# Patient Record
Sex: Female | Born: 1975 | Race: Asian | Hispanic: No | Marital: Single | Smoking: Never smoker
Health system: Southern US, Community
[De-identification: ages and names within clinical notes are randomized; demographics above are authoritative.]

## PROBLEM LIST (undated history)

## (undated) DIAGNOSIS — I83813 Varicose veins of bilateral lower extremities with pain: Secondary | ICD-10-CM

## (undated) HISTORY — DX: Varicose veins of bilateral lower extremities with pain: I83.813

---

## 2005-08-21 ENCOUNTER — Ambulatory Visit (HOSPITAL_COMMUNITY): Admission: RE | Admit: 2005-08-21 | Discharge: 2005-08-21 | Payer: Self-pay | Admitting: Obstetrics and Gynecology

## 2005-08-21 ENCOUNTER — Encounter (INDEPENDENT_AMBULATORY_CARE_PROVIDER_SITE_OTHER): Payer: Self-pay | Admitting: *Deleted

## 2006-09-08 ENCOUNTER — Inpatient Hospital Stay (HOSPITAL_COMMUNITY): Admission: AD | Admit: 2006-09-08 | Discharge: 2006-09-11 | Payer: Self-pay | Admitting: Obstetrics and Gynecology

## 2010-08-16 NOTE — Op Note (Signed)
[name], [name]                ACCOUNT NO.:  [id number]   MEDICAL RECORD NO.:  [id number]          PATIENT TYPE:  INP   LOCATION:  9165                          [facility]ACILITY:  [facility]   PHYSICIAN:  Gerrit Friends. Aldona Bar, M.D.   DATE OF BIRTH:  1975-12-01   DATE OF PROCEDURE:  09/08/2006  DATE OF DISCHARGE:                               OPERATIVE REPORT   PREOPERATIVE DIAGNOSIS:  Term pregnancy, labor, unfavorable for vaginal  delivery.   POSTOPERATIVE DIAGNOSIS:  Term pregnancy, labor, unfavorable for vaginal  delivery, delivery of 7 pounds 11 ounces female infant, Apgars 7/9, and  meconium stained amniotic fluid.   PROCEDURE:  Primary low transverse cesarean section.   SURGEON:  Gerrit Friends. Aldona Bar, M.D.   ANESTHESIA:  Spinal, Dr. Pamalee Leyden.   HISTORY:  This 35 year old gravida 2, para 0, presented at term on the  morning of June 7 in what appeared to be early labor with a reassuring  fetal heart tracing.  According to the patient and her husband, she had  been contracting for approximately 24 hours with contractions increasing  in discomfort level and frequency.  When initially examined, the vertex  was presenting at -4 station, vertex was ballottable, cervix was  fingertip dilated, 50% effaced.  The patient was contracting about every  6-8 minutes and was very uncomfortable.   The patient was admitted and observed. Her contractions continued.  The  cervix dilated a slight bit more, but the presenting part remained  essentially ballottable.  Her discomfort level was extremely.  It was my  feeling that in view of the patient's overall size that this probably  represented a cephalopelvic that portion and due to the fact that she  was a primigravida with a totally unengaged vertex, after a thorough  discussion with the patient, her husband, and her family, the decision  was made to proceed with primary low transverse cesarean section for  delivery because of her being unfavorable for vaginal  delivery.   DESCRIPTION OF PROCEDURE:  The patient was taken to the operating room  where, after the satisfactory induction of spinal anesthetic by Dr.  Pamalee Leyden, the patient was prepped and draped with a Foley catheter  inserted as part of the prep.   Once the base was adequately draped and good anesthetic levels  documented, the procedure was begun.  A Pfannenstiel incision was made  with minimal difficulty and dissected down sharply to and through the  fascia in a low transverse fashion.  Hemostasis was created at each  layer.  The subfascial space was created inferiorly and superiorly,  muscles separated in the midline, peritoneum identified, and appropriate  care taken to avoid bowel superiorly and the bladder inferiorly.  Membranes were intact but there was obvious meconium of a very light  nature. Once the membranes were ruptured, attempted delivery was carried  out and ultimately required the vacuum extractor to facilitate the  delivery from the vertex position.  The patient was delivered a viable  female infant which cried spontaneously once and once the cord was clamped  and cut, the infant was passed  off to the awaiting team and subsequently  taken to the regular nursery in good condition.  Subsequently, the  weight was found to be 7 pounds 11 ounces and Apgars were assigned 7/9.   After cord bloods were collected, the placenta was delivered intact.  The uterus was then exteriorized and rendered free of any remaining  products of conception.  Then, closure of the uterine incision was  carried out with #1 Vicryl in a running locked fashion.  Good uterine  contractility was afforded with slowly given intravenous Pitocin and  stimulation.  Several figure-of-eight #1 Vicryls were placed for  additional hemostasis and to reinforce the primary suture line.  The  uterine incision at this time was noted to be dry.  The uterus was well  contracted.  The tubes and ovaries were normal.   The abdomen, at this  time, was lavaged of all free blood and clot, the uterus replaced in the  abdominal cavity.  All counts were noted to be correct and no foreign  bodies at this time were noted to be within the abdominal cavity.   At this time, closure of the abdomen was carried out in layers.  The  abdominal peritoneum was closed with 0 Vicryl in a running fashion and  muscles secured with the same.  The fascia, at this time, was  reapproximated with 0 Vicryl from angle to midline bilaterally.  Subcutaneous tissues were rendered hemostatic and staples were used to  close the skin.  A sterile pressure dressing was applied.  The patient  was transported to the recovery room in satisfactory condition, having  tolerated the procedure well.  Estimated blood loss 500 mL.  All counts  correct x2.  At the conclusion of the procedure, both mother and baby  were doing well in their respective recovery areas.   In summary, this patient presented with a floating vertex at term and  was a primigravida.  It was felt that estimated fetal weight was at  least 7 to 7 1/2 pounds and this was probably the mitigating factor in  that this was probably to big for this petite female.  She was taken to  the operating room for primary low transverse cesarean section and was  delivered of a 7 pounds 11 ounces female infant with Apgars of 7/9.      Gerrit Friends. Aldona Bar, M.D.  Electronically Signed     RMW/MEDQ  D:  09/08/2006  T:  09/08/2006  Job:  [id number]

## 2010-08-19 NOTE — Op Note (Signed)
NAMENIGERIA, LASSETER                ACCOUNT NO.:  [id number]   MEDICAL RECORD NO.:  [id number]          PATIENT TYPE:  AMB   LOCATION:  SDC                           [facility]:  [facility]   PHYSICIAN:  Malva Limes, M.D.    DATE OF BIRTH:  04/28/1975   DATE OF PROCEDURE:  08/21/2005  DATE OF DISCHARGE:                                 OPERATIVE REPORT   PREOPERATIVE DIAGNOSIS:  Missed AB.   POSTOP DIAGNOSIS:  Missed AB.   PROCEDURE:  Dilation, evacuation.   SURGEON:  Dr. Dareen Piano.   ANESTHESIA:  MAC paracervical block.   ESTIMATED BLOOD LOSS:  Minimal.   COMPLICATIONS:  None.   SPECIMEN:  Products of conception sent to pathology.   ANTIBIOTICS:  Ancef 1 gram.   DRAINS:  None.   PROCEDURE:  The patient was taken to the operating room where she was placed  in dorsal lithotomy position.  The patient had a MAC anesthesia administered  without complication.  She was then prepped with Betadine and draped in  usual fashion for this procedure.  A sterile speculum was placed in vagina,  10 mL of 1% lidocaine used for paracervical block.  Single-tooth tenaculum  applied to anterior cervical lip.  The cervical os was then dilated to a 29-  [place] and 7 mm suction cannula was placed in the uterine cavity and  products of conception withdrawn. Sharp curettage was then performed  followed by repeat suction.  The patient tolerated the procedure well.  She  was discharged home.  She will follow up in the office in 4 weeks.  She will  be given Keflex 500 mg q.i.d. for 2 days.  She will be given RhoGAM if Rh  negative.           ______________________________  Malva Limes, M.D.     MA/MEDQ  D:  08/21/2005  T:  08/21/2005  Job:  [id number]

## 2010-08-19 NOTE — Discharge Summary (Signed)
[name], [name]                ACCOUNT NO.:  [id number]   MEDICAL RECORD NO.:  [id number]          PATIENT TYPE:  INP   LOCATION:  9124                          [facility]   PHYSICIAN:  Kendra H. Tenny Craw, MD     DATE OF BIRTH:  May 09, 1975   DATE OF ADMISSION:  09/08/2006  DATE OF DISCHARGE:  09/11/2006                               DISCHARGE SUMMARY   FINAL DIAGNOSIS:  1. Intrauterine pregnancy at term in active labor cream and      unfavorable for vaginal delivery.  2. Meconium-stained amniotic fluid.   PROCEDURE:  Primary low transverse cesarean section.  Surgeon: Dr.  Annamaria Helling.  Complications:  None.   This 35 year old G2, P0-0-1-0, presents at term in early labor with  reassuring fetal heart tracing.  The patient said she had been  contracting for about 24 hours with increasing level of discomfort and  frequency. Upon initial exam, the patient's cervix was only fingertip  dilated, 50% effaced, at a -4 station, and she was contracting about  every 6-8 minutes.  The patient's antepartum course up to this point has  been uncomplicated without any problems.  She had a negative group B  strep culture obtained in our office at 36 weeks.   HOSPITAL COURSE:  She was admitted in this early labor with continued  contractions.  Her cervix dilated slightly more, but the presenting part  remained essentially ballottable, and her discomfort was extreme.  Because of the overall size of the baby and fact that the patient is a  G1, P0, with an unengaged vertex,  discussion was held with the patient  and her husband , and decision was made to proceed with cesarean  section.   The patient was taken to the operating room on September 08, 2006, by Dr.  Annamaria Helling where a primary low transverse cesarean section was  performed with the delivery of a 7 pound 11 ounce female infant with  Apgars of 7/9. The delivery went without complications.   The patient's postoperative course was benign  without any significant  fevers.  They did not want their little boy circumcised before  discharge. The patient was felt ready for discharge on postoperative day  #3 and was sent home on a regular diet, told to decrease activities, was  given Percocet one to two every 4 hours as needed for pain, was to  follow up in our office in 4 weeks.   LABORATORY DATA ON DISCHARGE:  The patient had a hemoglobin of 9.7,  white blood cell count of 10.0 and platelets of 152,000.     Leilani Able, P.A.-C.      Freddrick March. Tenny Craw, MD  Electronically Signed   MB/MEDQ  D:  10/12/2006  T:  10/13/2006  Job:  [id number]

## 2011-01-19 LAB — COMPREHENSIVE METABOLIC PANEL
ALT: 16
Calcium: 8.5
Glucose, Bld: 91
Sodium: 134 — ABNORMAL LOW
Total Protein: 5.8 — ABNORMAL LOW

## 2011-01-19 LAB — CBC
HCT: 28.1 — ABNORMAL LOW
Hemoglobin: 12.4
Hemoglobin: 9.7 — ABNORMAL LOW
MCHC: 34.1
MCV: 92.1
Platelets: 180
RDW: 13.8
RDW: 14.2 — ABNORMAL HIGH

## 2011-01-19 LAB — URIC ACID: Uric Acid, Serum: 3.5

## 2011-01-19 LAB — LACTATE DEHYDROGENASE: LDH: 129

## 2011-01-19 LAB — RAPID HIV SCREEN (WH-MAU): Rapid HIV Screen: NONREACTIVE

## 2011-07-05 ENCOUNTER — Ambulatory Visit: Payer: BC Managed Care – PPO | Admitting: Emergency Medicine

## 2011-07-05 ENCOUNTER — Ambulatory Visit: Payer: BC Managed Care – PPO

## 2011-07-05 VITALS — BP 109/76 | HR 99 | Temp 98.2°F | Resp 16 | Ht 62.0 in | Wt 127.6 lb

## 2011-07-05 DIAGNOSIS — R51 Headache: Secondary | ICD-10-CM

## 2011-07-05 DIAGNOSIS — R509 Fever, unspecified: Secondary | ICD-10-CM

## 2011-07-05 DIAGNOSIS — M791 Myalgia, unspecified site: Secondary | ICD-10-CM

## 2011-07-05 DIAGNOSIS — J029 Acute pharyngitis, unspecified: Secondary | ICD-10-CM

## 2011-07-05 LAB — COMPREHENSIVE METABOLIC PANEL
ALT: 35 U/L (ref 0–35)
AST: 23 U/L (ref 0–37)
Albumin: 4.4 g/dL (ref 3.5–5.2)
Calcium: 9.3 mg/dL (ref 8.4–10.5)
Chloride: 97 mEq/L (ref 96–112)
Potassium: 3.9 mEq/L (ref 3.5–5.3)
Sodium: 135 mEq/L (ref 135–145)
Total Protein: 8.2 g/dL (ref 6.0–8.3)

## 2011-07-05 LAB — POCT URINALYSIS DIPSTICK
Bilirubin, UA: NEGATIVE
Leukocytes, UA: NEGATIVE

## 2011-07-05 LAB — POCT CBC
Granulocyte percent: 70 %G (ref 37–80)
HCT, POC: 38.9 % (ref 37.7–47.9)
MID (cbc): 1 — AB (ref 0–0.9)
POC Granulocyte: 9.1 — AB (ref 2–6.9)
POC LYMPH PERCENT: 22.2 %L (ref 10–50)
Platelet Count, POC: 246 10*3/uL (ref 142–424)
RBC: 4.42 M/uL (ref 4.04–5.48)
RDW, POC: 13.2 %

## 2011-07-05 LAB — POCT UA - MICROSCOPIC ONLY

## 2011-07-05 LAB — POCT URINE PREGNANCY: Preg Test, Ur: NEGATIVE

## 2011-07-05 LAB — POCT SEDIMENTATION RATE: POCT SED RATE: 89 mm/hr — AB (ref 0–22)

## 2011-07-05 MED ORDER — BUTALBITAL-APAP-CAFFEINE 50-325-40 MG PO TABS: ORAL_TABLET | ORAL | Status: DC

## 2011-07-05 NOTE — Progress Notes (Signed)
  Subjective:    Patient ID: [name], female    DOB: 1975-05-17, 36 y.o.   MRN: [id number]  HPI patient states she traveled to [place]. She returned on February 25. Since that time she has had temperatures up to 104105. She has fevers chills myalgias as well as a severe sore throat with swollen glands in her neck. She denies nausea or vomiting she denies cough she denies any burning stinging or pain on urination. She is also complaining of a persistent headache and has been unable to get relief of this.    Review of Systems she had her menstrual period approximately one week ago.     Objective:   Physical Exam  Constitutional: She appears well-developed.       Patient appears more chronically ill than acutely ill. She does not appear toxic  HENT:  Head: Normocephalic and atraumatic.  Eyes: Pupils are equal, round, and reactive to light.  Neck: Neck supple. No thyromegaly present.       She has shoddy anterior and posterior cervical nodes  Cardiovascular: Normal rate, regular rhythm and normal heart sounds.  Exam reveals no gallop and no friction rub.   No murmur heard. Pulmonary/Chest: No respiratory distress. She has no wheezes. She has no rales. She exhibits no tenderness.  Abdominal: She exhibits no distension and no mass. There is no tenderness. There is no rebound and no guarding.  Musculoskeletal: She exhibits no edema.  Skin: Skin is warm and dry.    [facility] reading (PRIMARY) by  Dr.Teandra Harlan  x-ray showed no acute disease .      Assessment & Plan:  Assessment as FUO. Patient is traveled to [place] so tropical diseases are definitely in the differential including typhoid and malaria she does have evidence of infection of the tonsils however her strep test is negative when ahead and send off a throat culture. As well as checking EBV and CMV titers. Blood cultures were done to look for typhoid. See med should help [place] regarding her liver function tests. Also check a thyroid at  patient request

## 2011-07-05 NOTE — Patient Instructions (Signed)
Please go to [facility] and have smears done for malaria. Please see me at 1 PM on Friday to go over all your test results

## 2011-07-06 LAB — EPSTEIN-BARR VIRUS VCA ANTIBODY PANEL: EBV VCA IgM: 0.16 {ISR}

## 2011-07-07 ENCOUNTER — Encounter: Payer: Self-pay | Admitting: Radiology

## 2011-07-07 LAB — CMV IGM: CMV IgM: 0.16 (ref <0.90)

## 2011-07-07 LAB — CULTURE, GROUP A STREP: Organism ID, Bacteria: NORMAL

## 2012-06-11 ENCOUNTER — Ambulatory Visit: Payer: Self-pay | Admitting: Family Medicine

## 2012-06-11 VITALS — BP 120/80 | HR 72 | Temp 98.2°F | Resp 16 | Ht 62.0 in | Wt 121.6 lb

## 2012-06-11 DIAGNOSIS — L0291 Cutaneous abscess, unspecified: Secondary | ICD-10-CM

## 2012-06-11 DIAGNOSIS — L0390 Cellulitis, unspecified: Secondary | ICD-10-CM

## 2012-06-11 MED ORDER — DOXYCYCLINE HYCLATE 100 MG PO TABS: 100.0000 mg | ORAL_TABLET | Freq: Two times a day (BID) | ORAL | Status: DC

## 2012-06-11 NOTE — Progress Notes (Signed)
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  Onset:  March 2  Obj:  NAD Multiple extremity 3 mm pustules  Assessment: staph infection  Plan:  Doxycycline Cellulitis - Plan: doxycycline (VIBRA-TABS) 100 MG tablet, DISCONTINUED: doxycycline (VIBRA-TABS) 100 MG tablet    Elvina Sidle, MD

## 2012-07-04 ENCOUNTER — Encounter: Payer: Self-pay | Admitting: Emergency Medicine

## 2013-10-16 IMAGING — CR DG CHEST 2V
2 series · 2 of 2 positions shown · non-contrast
Comparison: None.

CLINICAL DATA: 36-year-old female with fever and chills.

CHEST - 2 VIEW

[PA]
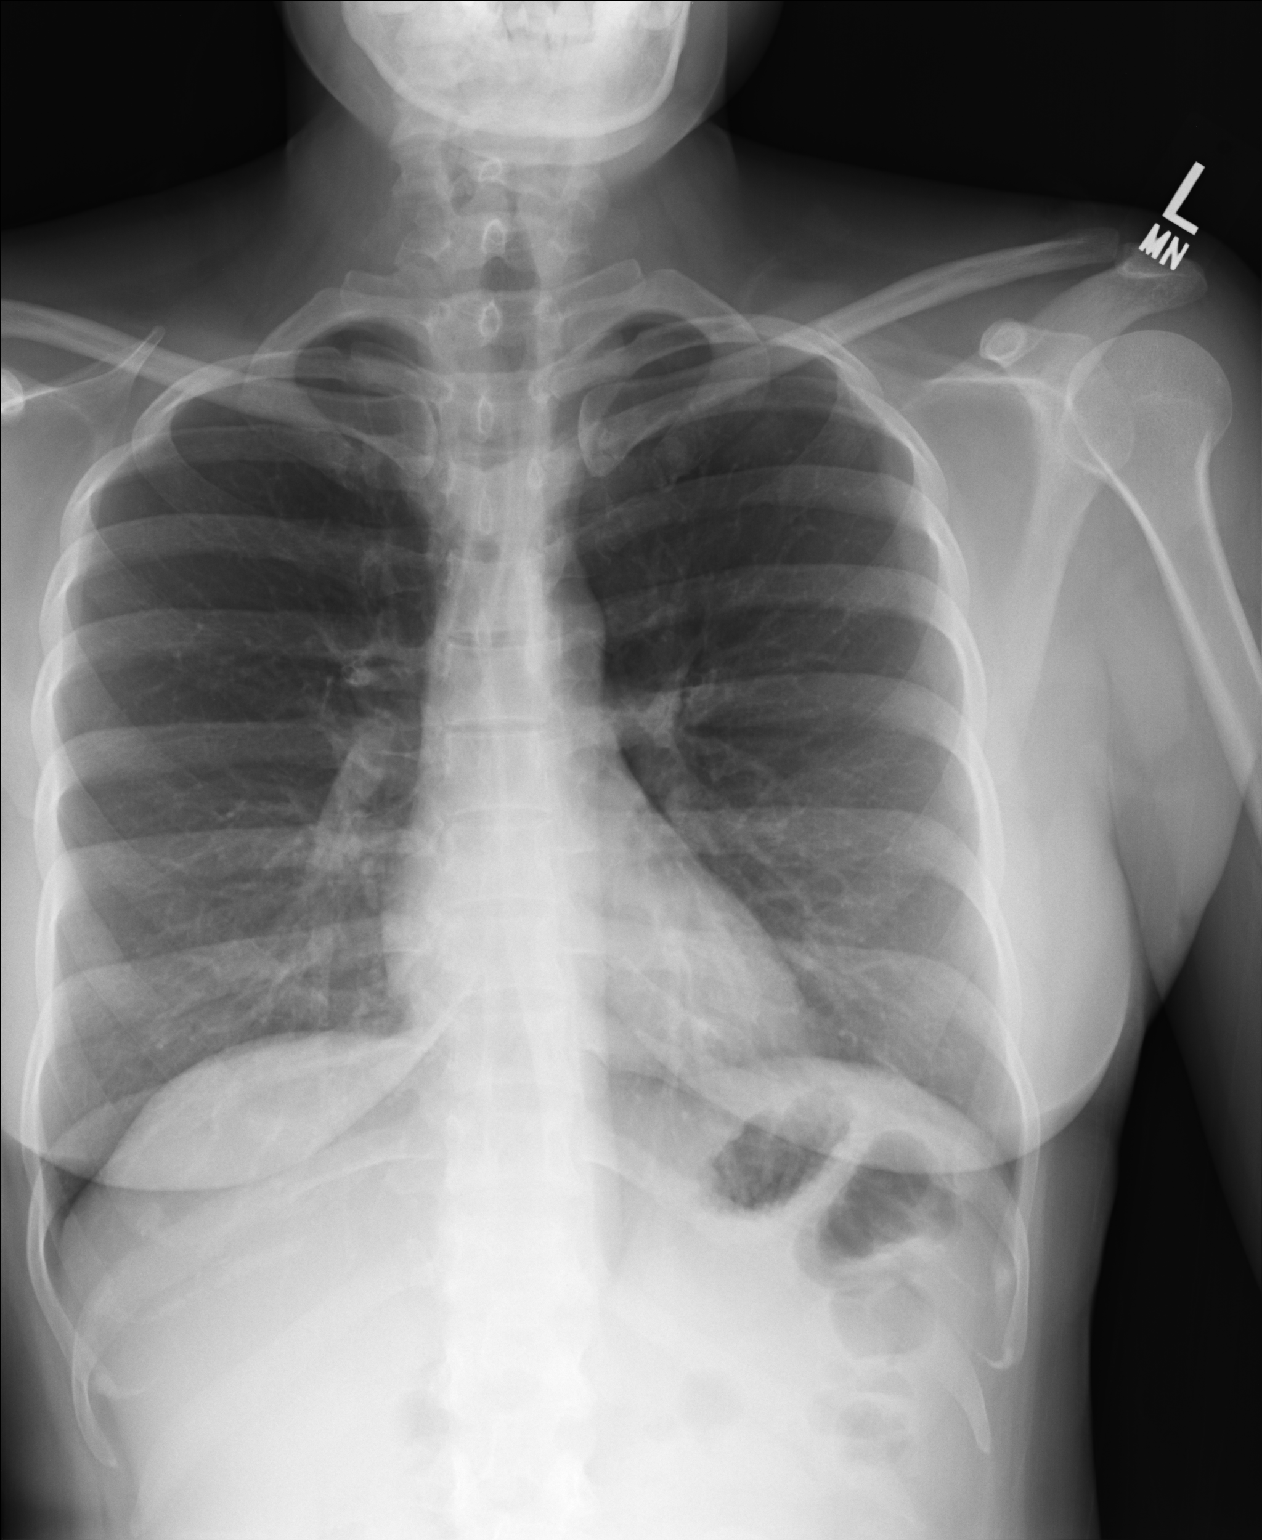

[lateral]
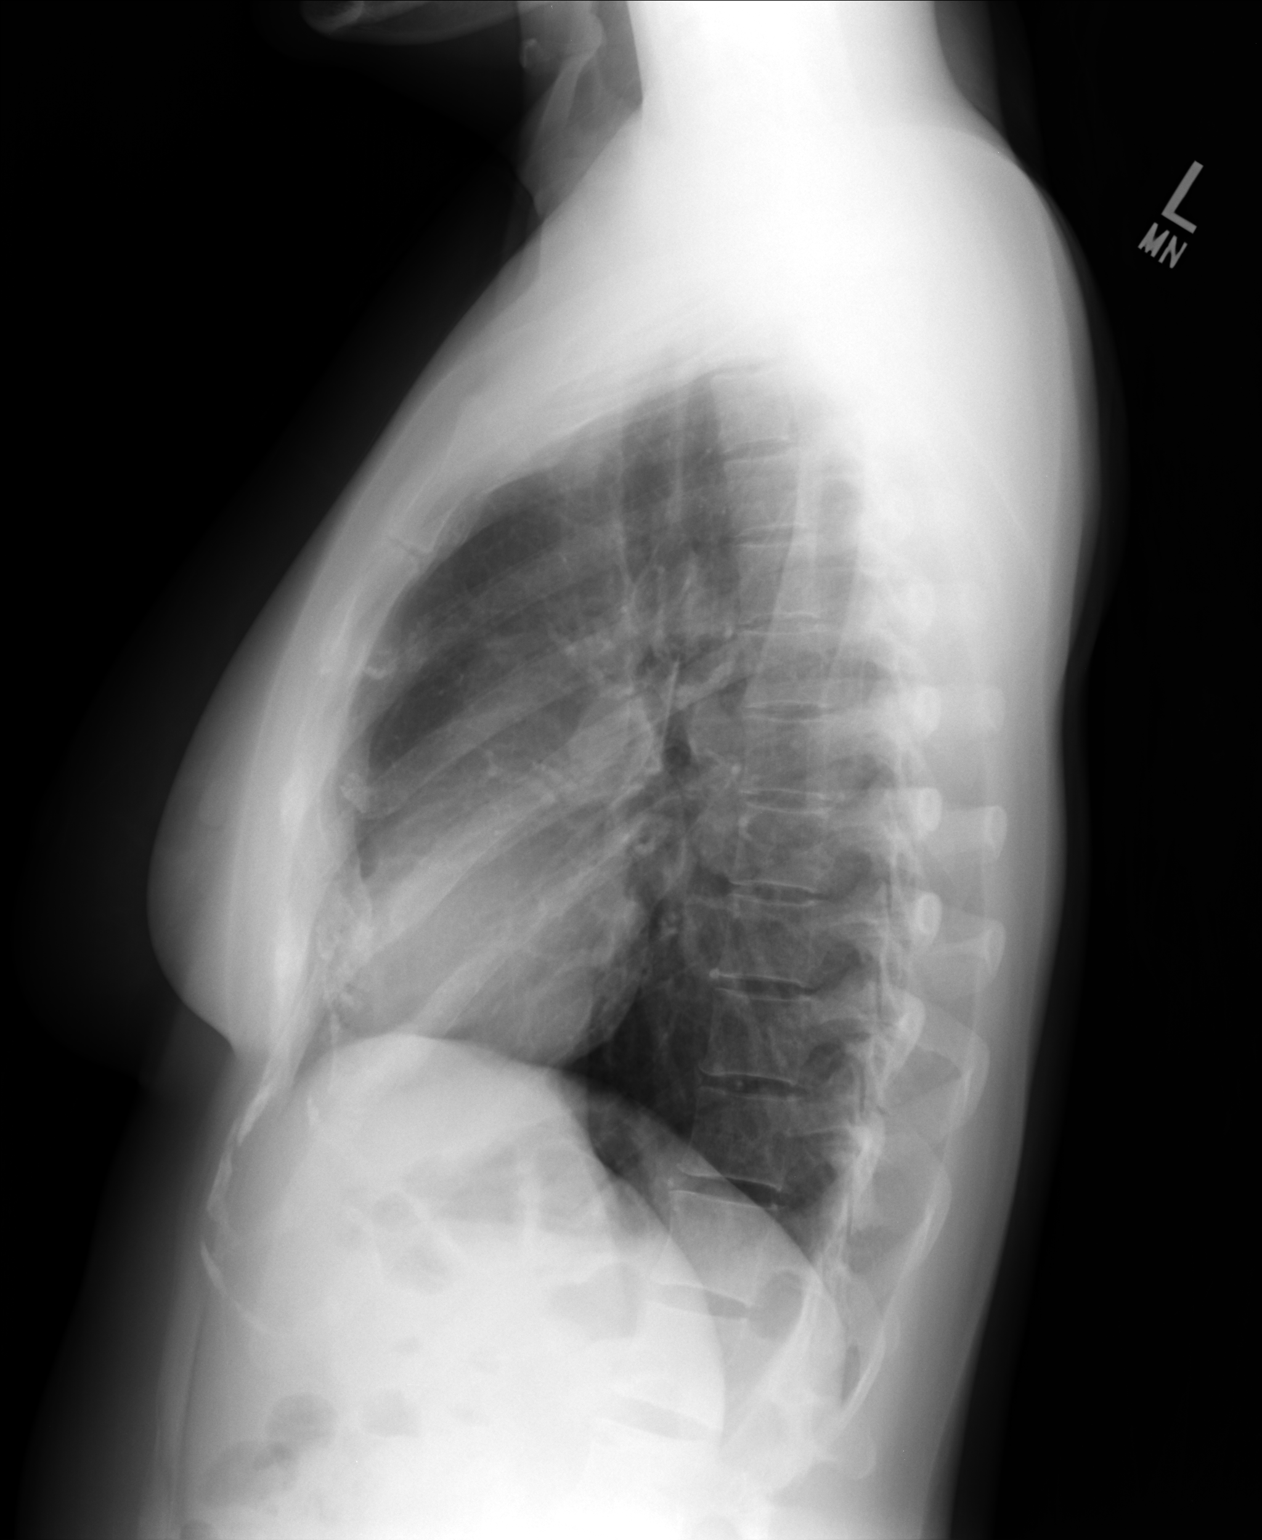

[2 of 2 positions shown; findings below may reference images not displayed]

FINDINGS: Normal lung volumes. Normal cardiac size and mediastinal
contours.  Visualized tracheal air column is within normal limits.
The lungs are clear.  No effusion. No osseous abnormality
identified.    Negative visualized bowel gas.
IMPRESSION: Negative, no acute cardiopulmonary abnormality.

## 2017-05-01 ENCOUNTER — Encounter: Payer: Self-pay | Admitting: Family Medicine

## 2017-05-01 ENCOUNTER — Ambulatory Visit (INDEPENDENT_AMBULATORY_CARE_PROVIDER_SITE_OTHER): Payer: BLUE CROSS/BLUE SHIELD | Admitting: Family Medicine

## 2017-05-01 VITALS — BP 108/80 | HR 72 | Temp 98.1°F | Resp 12 | Ht 62.0 in | Wt 142.0 lb

## 2017-05-01 DIAGNOSIS — R51 Headache: Secondary | ICD-10-CM | POA: Diagnosis not present

## 2017-05-01 DIAGNOSIS — R0789 Other chest pain: Secondary | ICD-10-CM

## 2017-05-01 DIAGNOSIS — I83893 Varicose veins of bilateral lower extremities with other complications: Secondary | ICD-10-CM

## 2017-05-01 DIAGNOSIS — R0600 Dyspnea, unspecified: Secondary | ICD-10-CM | POA: Diagnosis not present

## 2017-05-01 DIAGNOSIS — R519 Headache, unspecified: Secondary | ICD-10-CM | POA: Insufficient documentation

## 2017-05-01 NOTE — Patient Instructions (Addendum)
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 Muscular, GERD.  Chest Wall Pain Chest wall pain is pain in or around the bones and muscles of your chest. Sometimes, an injury causes this pain. Sometimes, the cause may not be known. This pain may take several weeks or longer to get better. Follow these instructions at home: Pay attention to any changes in your symptoms. Take these actions to help with your pain:  Rest as told by your health care provider.  Avoid activities that cause pain. These include any activities that use your chest muscles or your abdominal and side muscles to lift heavy items.  If directed, apply ice to the painful area: ? Put ice in a plastic bag. ? Place a towel between your skin and the bag. ? Leave the ice on for 20 minutes, 2-3 times per day.  Take over-the-counter and prescription medicines only as told by your health care provider.  Do not use tobacco products, including cigarettes, chewing tobacco, and e-cigarettes. If you need help quitting, ask your health care provider.  Keep all follow-up visits as told by your health care provider. This is important.  Contact a health care provider if:  You have a fever.  Your chest pain becomes worse.  You have new symptoms. Get help right away if:  You have nausea or vomiting.  You feel sweaty or light-headed.  You have a cough with phlegm (sputum) or you cough up blood.  You develop shortness of breath. This information is not intended to replace advice given to you by your health care provider. Make sure you discuss any questions you have with your health care provider. Document Released: 03/20/2005 Document Revised: 07/29/2015 Document Reviewed: 06/15/2014 Elsevier Interactive Patient Education  2018 [employer]  Vein disease is a condition  that can affect the veins in the legs. It can cause leg pain, varicose veins, swollen legs, or open sores. Varicose veins are swollen and twisted veins. Things that may help: leg exercises (ankle flexion, walking),compression stocking, OTC horse chestnut seed extract 300 mg twice daily, for itchy skin (if any) cortisone and moisturizers.  Compression stockings- [facility] in [place]   Please be sure medication list is accurate. If a new problem present, please set up appointment sooner than planned today.

## 2017-05-01 NOTE — Progress Notes (Signed)
HPI:   [name] is a 41 y.o. female, who is here today with her boyfriend to establish care.  Former PCP: N/A Last preventive routine visit: 2014.  Chronic medical problems: Otherwise healthy, except for lower extremity varicose veins. Is still having achy and heavy sensation on her lower extremities, exacerbated by prolonged standing. Compression stockings do not help much. She has not varicose veins surgery on both extremities.  According to patient, surgeon did not recommend more surgeries. She denies skin lesions or edema.  She is also complaining of 4-6 months of intermittent shortness of breath and chest pressure like pain, "sometimes", usually at rest. Describes sensation like she "cannot get enough O2" and needs to open windows to get air. No wheezing or cough.  Symptoms seem to be exacerbated by stress and sometimes upon reaching to get something. Alleviated by "calming down." Last episode about 2 days ago.  She denies associated palpitations, nausea, vomiting, diaphoresis, or syncope. Episodes last about 5 minutes. She does not exercise regularly but has a "very active job", she denies any above described symptoms with exertion.   Also occasional left frontal headache, usually during stressful days.  No associated visual changes, photophobia, nausea, vomiting, or focal deficit.  She can have a headache a few times per month or every 6 months, frequency depends of the level of stress.  She denies history of anxiety or depression.  She takes OTC Ibuprofen if needed, not very often.    Review of Systems  Constitutional: Negative for activity change, appetite change, fatigue and fever.  HENT: Negative for mouth sores, nosebleeds and trouble swallowing.   Eyes: Negative for redness and visual disturbance.  Respiratory: Positive for shortness of breath. Negative for cough and wheezing.   Cardiovascular: Positive for chest pain. Negative for palpitations and  leg swelling.  Gastrointestinal: Negative for abdominal pain, nausea and vomiting.       Negative for changes in bowel habits.  Endocrine: Negative for cold intolerance and heat intolerance.  Genitourinary: Negative for decreased urine volume, dysuria and hematuria.  Musculoskeletal: Positive for myalgias. Negative for gait problem.  Skin: Negative for pallor and rash.  Neurological: Negative for syncope, weakness, numbness and headaches.  Psychiatric/Behavioral: Negative for confusion and sleep disturbance. The patient is nervous/anxious.       No current outpatient medications on file prior to visit.   No current facility-administered medications on file prior to visit.      Past Medical History:  Diagnosis Date  . Varicose veins of bilateral lower extremities with pain    S/P surgery 08/2016 and 12/2016   Allergies  Allergen Reactions  . Naproxen Nausea And Vomiting    Family History  Problem Relation Age of Onset  . Diabetes Father   . Hypertension Father   . Asthma Son     Social History   Socioeconomic History  . Marital status: Married    Spouse name: None  . Number of children: None  . Years of education: None  . Highest education level: None  Social Needs  . Financial resource strain: None  . Food insecurity - worry: None  . Food insecurity - inability: None  . Transportation needs - medical: None  . Transportation needs - non-medical: None  Occupational History  . None  Tobacco Use  . Smoking status: Never Smoker  . Smokeless tobacco: Never Used  Substance and Sexual Activity  . Alcohol use: No    Frequency: Never  . Drug  use: No  . Sexual activity: Yes  Other Topics Concern  . None  Social History Narrative  . None    Vitals:   05/01/17 1529  BP: 108/80  Pulse: 72  Resp: 12  Temp: 98.1 F (36.7 C)  SpO2: 100%    Body mass index is 25.97 kg/m.   Physical Exam  Nursing note and vitals reviewed. Constitutional: She is oriented to  person, place, and time. She appears well-developed and well-nourished. No distress.  HENT:  Head: Normocephalic and atraumatic.  Mouth/Throat: Oropharynx is clear and moist and mucous membranes are normal.  Eyes: Conjunctivae are normal. Pupils are equal, round, and reactive to light.  Neck: No tracheal deviation present. No thyroid mass and no thyromegaly present.  Cardiovascular: Normal rate and regular rhythm.  No murmur heard. Pulses:      Dorsalis pedis pulses are 2+ on the right side, and 2+ on the left side.  Varicose vein LE, L>R  Respiratory: Effort normal and breath sounds normal. No respiratory distress. She exhibits no tenderness.  GI: Soft. She exhibits no mass. There is no hepatomegaly. There is no tenderness.  Musculoskeletal: She exhibits no edema.  Lymphadenopathy:    She has no cervical adenopathy.  Neurological: She is alert and oriented to person, place, and time. She has normal strength. Gait normal.  Skin: Skin is warm. No rash noted. No erythema.  Psychiatric: Her mood appears anxious.  Well groomed, good eye contact.     ASSESSMENT AND PLAN:   [name] was seen today for establish care.  Diagnoses and all orders for this visit:  Dyspnea, unspecified type  We discussed possible etiologies. ? Anxiety. I do not think imaging is needed at this time. She does not want lab work done today , prefers to wait until she comes back for her physical. Instructed about warning signs.  -     EKG 12-Lead  Chest pressure  History does not suggest cardiac etiology, low probability given her CV risk factors. ?  Wall chest pain. Possible causes discussed. EKG today SR,normal axis and intervals, no abnormalities. No other EKG available for comparison. Instructed about warning signs. 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 Encouraged to wear compression stockings  daily. Follow-up as needed.  Headache, unspecified headache type  ? Tension headache vs migraine. She can continue OTC Ibuprofen if needed. Instructed about warning signs.     Betty G. Swazil[place]Jordan, MD  [facility]. [place] office.

## 2017-05-29 ENCOUNTER — Encounter: Payer: BLUE CROSS/BLUE SHIELD | Admitting: Family Medicine

## 2017-05-31 NOTE — Progress Notes (Signed)
HPI:   [name] is a 42 y.o. female, who is here today with her fiance for her routine physical.  Last CPE: 2014  Regular exercise 3 or more time per week: Not regularly but she has an active job. Following a healthy diet: Yes  She lives with her son.  Chronic medical problems: Headache,varicose veins.  Pap smear 2016 Hx of abnormal pap smears: Denies Hx of STD's Denies  Condoms for birth control.   There is no immunization history on file for this patient.  Mammogram: She has not had one and she is not interested.   She has no new concerns today.  I saw her on 05/01/17,when she was c/o a few months of fatigue and SOB with chest pressure sensation. Intermittent,symptoms are not related with exertion. She has not had dyspnea,staes that it is mainly fatigue. Symptoms exacerbated by stress.  She did not have blood work because she was not sure about insurance coverage.  She is planning on going to [place] to take care of her mother,who has been sick. She is planning on staying there for a month.   Review of Systems  Constitutional: Positive for fatigue. Negative for appetite change, diaphoresis, fever and unexpected weight change.  HENT: Negative for dental problem, hearing loss, mouth sores, trouble swallowing and voice change.   Eyes: Negative for photophobia and visual disturbance.  Respiratory: Negative for cough and wheezing.   Cardiovascular: Negative for chest pain, palpitations and leg swelling.  Gastrointestinal: Negative for abdominal pain, nausea and vomiting.       No changes in bowel habits.  Endocrine: Negative for cold intolerance, heat intolerance, polydipsia, polyphagia and polyuria.  Genitourinary: Negative for decreased urine volume, dysuria, hematuria, vaginal bleeding and vaginal discharge.  Musculoskeletal: Negative for arthralgias, gait problem and neck pain.  Skin: Negative for color change and rash.  Allergic/Immunologic: Negative  for environmental allergies.  Neurological: Positive for headaches (No more frequent that usual. Hx of chronic headaches.). Negative for seizures, syncope, weakness and numbness.  Hematological: Negative for adenopathy. Does not bruise/bleed easily.  Psychiatric/Behavioral: Negative for confusion and sleep disturbance. The patient is not nervous/anxious.   All other systems reviewed and are negative.     No current outpatient medications on file prior to visit.   No current facility-administered medications on file prior to visit.      Past Medical History:  Diagnosis Date  . Varicose veins of bilateral lower extremities with pain    S/P surgery 08/2016 and 12/2016    Past Surgical History:  Procedure Laterality Date  . CESAREAN SECTION      Allergies  Allergen Reactions  . Naproxen Nausea And Vomiting    Family History  Problem Relation Age of Onset  . Diabetes Father   . Hypertension Father   . Asthma Son     Social History   Socioeconomic History  . Marital status: Single    Spouse name: None  . Number of children: None  . Years of education: None  . Highest education level: None  Social Needs  . Financial resource strain: None  . Food insecurity - worry: None  . Food insecurity - inability: None  . Transportation needs - medical: None  . Transportation needs - non-medical: None  Occupational History  . None  Tobacco Use  . Smoking status: Never Smoker  . Smokeless tobacco: Never Used  Substance and Sexual Activity  . Alcohol use: No    Frequency: Never  .  Drug use: No  . Sexual activity: Yes  Other Topics Concern  . None  Social History Narrative  . None     Vitals:   06/01/17 0901  BP: 124/78  Pulse: 83  Resp: 12  Temp: 97.8 F (36.6 C)  SpO2: 99%   Body mass index is 25.49 kg/m.   Wt Readings from Last 3 Encounters:  06/01/17 139 lb 6 oz (63.2 kg)  05/01/17 142 lb (64.4 kg)  06/11/12 121 lb 9.6 oz (55.2 kg)    Physical Exam    Nursing note and vitals reviewed. Constitutional: She is oriented to person, place, and time. She appears well-developed. No distress.  HENT:  Head: Normocephalic and atraumatic.  Right Ear: Hearing, tympanic membrane, external ear and ear canal normal.  Left Ear: Hearing, tympanic membrane, external ear and ear canal normal.  Mouth/Throat: Uvula is midline, oropharynx is clear and moist and mucous membranes are normal.  Eyes: Conjunctivae are normal. Pupils are equal, round, and reactive to light.  Neck: No tracheal deviation present. No thyromegaly present.  Cardiovascular: Normal rate and regular rhythm.  No murmur heard. Pulses:      Dorsalis pedis pulses are 2+ on the right side, and 2+ on the left side.  Respiratory: Effort normal and breath sounds normal. No respiratory distress.  GI: Soft. She exhibits no mass. There is no hepatomegaly. There is no tenderness.  Genitourinary:  Genitourinary Comments: Breast: No masses,skin abnormalities,or nipple discharge.  Musculoskeletal: She exhibits no edema or tenderness.  No major deformity or sing of synovitis appreciated.  Lymphadenopathy:    She has no cervical adenopathy.       Right: No supraclavicular adenopathy present.       Left: No supraclavicular adenopathy present.  Neurological: She is alert and oriented to person, place, and time. She has normal strength. No cranial nerve deficit. Coordination and gait normal.  Reflex Scores:      Bicep reflexes are 2+ on the right side and 2+ on the left side.      Patellar reflexes are 2+ on the right side and 2+ on the left side. Skin: Skin is warm. No rash noted. No erythema.  Psychiatric: Her mood appears anxious.  Well groomed, poor eye contact.      ASSESSMENT AND PLAN:  [name] was here today annual physical examination.   Orders Placed This Encounter  Procedures  . Comprehensive metabolic panel  . Lipid panel   Lab Results  Component Value Date   CHOL 170  06/01/2017   HDL 42.40 06/01/2017   LDLCALC 101 (H) 06/01/2017   TRIG 134.0 06/01/2017   CHOLHDL 4 06/01/2017   Lab Results  Component Value Date   ALT 12 06/01/2017   AST 13 06/01/2017   ALKPHOS 50 06/01/2017   BILITOT 0.8 06/01/2017    Lab Results  Component Value Date   CREATININE 0.57 06/01/2017   BUN 6 06/01/2017   NA 138 06/01/2017   K 3.4 (L) 06/01/2017   CL 103 06/01/2017   CO2 28 06/01/2017     Routine general medical examination at a health care facility  We discussed the importance of regular physical activity and healthy diet for prevention of chronic illness and/or complications. Preventive guidelines reviewed. Vaccination : She needs Tdap,refused. She is due for mammogram,refused.  Next CPE in a year.  The 10-year ASCVD risk score Denman George(Goff DC Jr., et al., 2013) is: 0.7%   Values used to calculate the score:  Age: 51 years     Sex: Female     Is Non-Hispanic African American: No     Diabetic: No     Tobacco smoker: No     Systolic Blood Pressure: 124 mmHg     Is BP treated: No     HDL Cholesterol: 42.4 mg/dL     Total Cholesterol: 170 mg/dL  -     Comprehensive metabolic panel -     Lipid panel  Diabetes mellitus screening -     Comprehensive metabolic panel  Screening for lipid disorders -     Lipid panel  Chronic fatigue, unspecified  We discussed possible etiologies: Systemic illness, immunologic,endocrinology,sleep disorder, psychiatric/psychologic, infectious,medications side effects, and idiopathic. Examination today does not suggest a serious process. Healthy diet and regular physical activity may help.  Further recommendations will be given according to lab done today. She does not want TSH,CBC, B12,and Vit D done because not sure if her insurance will cover. She is going to [employer] labs are cheaper,so planning on having labs done there.    Return in 3 months (on 09/01/2017) for Fatigue.      Betty G. [place], MD  [facility]. [place] office.

## 2017-06-01 ENCOUNTER — Encounter: Payer: Self-pay | Admitting: Family Medicine

## 2017-06-01 ENCOUNTER — Ambulatory Visit (INDEPENDENT_AMBULATORY_CARE_PROVIDER_SITE_OTHER): Payer: BLUE CROSS/BLUE SHIELD | Admitting: Family Medicine

## 2017-06-01 VITALS — BP 124/78 | HR 83 | Temp 97.8°F | Resp 12 | Ht 62.0 in | Wt 139.4 lb

## 2017-06-01 DIAGNOSIS — Z131 Encounter for screening for diabetes mellitus: Secondary | ICD-10-CM

## 2017-06-01 DIAGNOSIS — Z0001 Encounter for general adult medical examination with abnormal findings: Secondary | ICD-10-CM | POA: Diagnosis not present

## 2017-06-01 DIAGNOSIS — Z13220 Encounter for screening for lipoid disorders: Secondary | ICD-10-CM | POA: Diagnosis not present

## 2017-06-01 DIAGNOSIS — R5382 Chronic fatigue, unspecified: Secondary | ICD-10-CM | POA: Diagnosis not present

## 2017-06-01 DIAGNOSIS — Z0000 Encounter for general adult medical examination without abnormal findings: Secondary | ICD-10-CM

## 2017-06-01 LAB — COMPREHENSIVE METABOLIC PANEL
ALT: 12 U/L (ref 0–35)
AST: 13 U/L (ref 0–37)
Albumin: 3.7 g/dL (ref 3.5–5.2)
Alkaline Phosphatase: 50 U/L (ref 39–117)
BILIRUBIN TOTAL: 0.8 mg/dL (ref 0.2–1.2)
BUN: 6 mg/dL (ref 6–23)
CO2: 28 meq/L (ref 19–32)
CREATININE: 0.57 mg/dL (ref 0.40–1.20)
Calcium: 9.4 mg/dL (ref 8.4–10.5)
Chloride: 103 mEq/L (ref 96–112)
GFR: 123.66 mL/min (ref >60.00)
GLUCOSE: 82 mg/dL (ref 70–99)
Potassium: 3.4 mEq/L — ABNORMAL LOW (ref 3.5–5.1)
Sodium: 138 mEq/L (ref 135–145)
Total Protein: 7.2 g/dL (ref 6.0–8.3)

## 2017-06-01 LAB — LIPID PANEL
CHOL/HDL RATIO: 4
Cholesterol: 170 mg/dL (ref 0–200)
HDL: 42.4 mg/dL (ref >39.00)
LDL Cholesterol: 101 mg/dL — ABNORMAL HIGH (ref 0–99)
NonHDL: 127.49
Triglycerides: 134 mg/dL (ref 0.0–149.0)
VLDL: 26.8 mg/dL (ref 0.0–40.0)

## 2017-06-01 NOTE — Patient Instructions (Addendum)
A few things to remember from today's visit:   Routine general medical examination at a health care facility - Plan: Comprehensive metabolic panel, Lipid panel  Diabetes mellitus screening - Plan: Comprehensive metabolic panel  Screening for lipid disorders - Plan: Lipid panel  Lab Results  Component Value Date   POCTSEDRATE 89 (A) 07/05/2011   Tdap.  CBC,TSH, CRP,and ESR. B12 and vit D  Today you have you routine preventive visit.  At least 150 minutes of moderate exercise per week, daily brisk walking for 15-30 min is a good exercise option. Healthy diet low in saturated (animal) fats and sweets and consisting of fresh fruits and vegetables, lean meats such as fish and white chicken and whole grains.  These are some of recommendations for screening depending of age and risk factors:   - Vaccines:  Tdap vaccine every 10 years. Needed.  Shingles vaccine recommended at age 36, could be given after 42 years of age but not sure about insurance coverage.   Pneumonia vaccines:  Prevnar 13 at 65 and Pneumovax at 56. Sometimes Pneumovax is giving earlier if history of smoking, lung disease,diabetes,kidney disease among some.    Screening for diabetes at age 27 and every 3 years.  Cervical cancer prevention:  Pap smear starts at 42 years of age and continues periodically until 42 years old in low risk women. Pap smear every 3 years between 34 and 14 years old. Pap smear every 3-5 years between women 63 and older if pap smear negative and HPV screening negative.   -Breast cancer: Mammogram: There is disagreement between experts about when to start screening in low risk asymptomatic female but recent recommendations are to start screening at 36 and not later than 42 years old , every 1-2 years and after 42 yo q 2 years. Screening is recommended until 42 years old but some women can continue screening depending of healthy issues.   Colon cancer screening: starts at 42 years old  until 42 years old.  Cholesterol disorder screening at age 110 and every 3 years.  Also recommended:  1. Dental visit- Brush and floss your teeth twice daily; visit your dentist twice a year. 2. Eye doctor- Get an eye exam at least every 2 years. 3. Helmet use- Always wear a helmet when riding a bicycle, motorcycle, rollerblading or skateboarding. 4. Safe sex- If you may be exposed to sexually transmitted infections, use a condom. 5. Seat belts- Seat belts can save your live; always wear one. 6. Smoke/Carbon Monoxide detectors- These detectors need to be installed on the appropriate level of your home. Replace batteries at least once a year. 7. Skin cancer- When out in the sun please cover up and use sunscreen 15 SPF or higher. 8. Violence- If anyone is threatening or hurting you, please tell your healthcare provider.  9. Drink alcohol in moderation- Limit alcohol intake to one drink or less per day. Never drink and drive.  Please be sure medication list is accurate. If a new problem present, please set up appointment sooner than planned today.

## 2017-06-04 ENCOUNTER — Encounter: Payer: Self-pay | Admitting: *Deleted

## 2017-07-23 ENCOUNTER — Telehealth: Payer: Self-pay

## 2017-07-23 NOTE — Telephone Encounter (Signed)
Patient brought in a bill for the amount of $77.77 and was questioning what it was for.   I spoke with [employer] and they state that [facility] and is being applied to pt's deductible.   I spoke with pt and attempted to explain this information to her. She states that she had called and was advised that a New Patient office visit was $260 and she wanted to know why this was not what had been billed. I advised that I was not aware of this conversation but that we typically will advise the lowest starting cost for an office visit with a disclaimer that this amount could increase based on the level of care given. I advised her in her chart OV notes Dr. [place] did bill for a 45 minute New Patient visit which is longer than the usual 30 minute visit, and this then may have been the reason the amount she was originally quoted was different. She was still not satisfied with this answer. She states "they didn't even take my blood pressure" and does not understand why it would have been billed for such long visit. I advised pt that per her chart an EKG was done and then had to be reviewed so this does take additional time. There are also documented vital signs. The patient was still not satisfied with these explanations. I advised her to call the billing office if she has any further questions, I provided her with the phone number. She asked for the address and I replied I did not have that information. She asked how was she supposed to know where they were and I suggested she call them if she wanted their address.

## 2019-02-24 ENCOUNTER — Other Ambulatory Visit (HOSPITAL_COMMUNITY): Payer: Self-pay | Admitting: Family Medicine

## 2019-02-24 DIAGNOSIS — M7989 Other specified soft tissue disorders: Secondary | ICD-10-CM

## 2019-02-24 DIAGNOSIS — M79605 Pain in left leg: Secondary | ICD-10-CM

## 2019-02-25 ENCOUNTER — Ambulatory Visit (HOSPITAL_COMMUNITY)
Admission: RE | Admit: 2019-02-25 | Discharge: 2019-02-25 | Disposition: A | Discharge status: Home / Self Care | Payer: PRIVATE HEALTH INSURANCE | Source: Ambulatory Visit | Attending: Family Medicine | Admitting: Family Medicine

## 2019-02-25 ENCOUNTER — Other Ambulatory Visit: Payer: Self-pay

## 2019-02-25 DIAGNOSIS — M79605 Pain in left leg: Secondary | ICD-10-CM

## 2019-02-25 DIAGNOSIS — M7989 Other specified soft tissue disorders: Secondary | ICD-10-CM

## 2019-02-25 NOTE — Progress Notes (Signed)
Left lower extremity venous duplex has been completed. Preliminary results can be found in CV Proc through chart review.  Results were given to Dr. Rip Harbour.  02/25/19 9:11 AM [name] RVT

## 2019-08-08 ENCOUNTER — Ambulatory Visit: Payer: Self-pay | Admitting: Family Medicine

## 2019-09-10 ENCOUNTER — Other Ambulatory Visit: Payer: Self-pay

## 2019-09-10 ENCOUNTER — Ambulatory Visit (INDEPENDENT_AMBULATORY_CARE_PROVIDER_SITE_OTHER): Payer: BC Managed Care – PPO | Admitting: Dermatology

## 2019-09-10 DIAGNOSIS — L309 Dermatitis, unspecified: Secondary | ICD-10-CM

## 2019-09-10 DIAGNOSIS — L720 Epidermal cyst: Secondary | ICD-10-CM

## 2019-09-10 MED ORDER — TRIAMCINOLONE ACETONIDE 0.1 % EX CREA: 1.0000 "application " | TOPICAL_CREAM | Freq: Every day | CUTANEOUS | 2 refills | Status: AC | PRN

## 2019-09-10 MED ORDER — TRIAMCINOLONE ACETONIDE 0.1 % EX CREA: 1.0000 "application " | TOPICAL_CREAM | Freq: Every day | CUTANEOUS | 2 refills | Status: DC | PRN

## 2019-09-10 NOTE — Progress Notes (Signed)
   Follow-Up Visit   Subjective  [name] is a 44 y.o. female who presents for the following: Skin Problem (Had a mole on back left side x years. Has gotten bigger a few months ago. But now its smaller. ) and Rash (Patient sometimes gets a rash on arms and neck area. It does itch and patient uses hydrocortisone on it. ).  Growth Location: Left back Duration:  Quality:  Associated Signs/Symptoms: Modifying Factors:  Severity:  Timing: Context:   The following portions of the chart were reviewed this encounter and updated as appropriate:     Objective  Well appearing patient in no apparent distress; mood and affect are within normal limits.  A focused examination was performed including Head, neck, back, chest, arms.. Relevant physical exam findings are noted in the Assessment and Plan.   Assessment & Plan  Dermatitis (3) Neck - Anterior; Left Forearm - Posterior; Right Forearm - Posterior  Topical triamcinolone daily after bathing for up to 1 month.  Avoid use on face and body folds.  Ordered Medications: triamcinolone cream (KENALOG) 0.1 %  Epidermal cyst Left Flank  May choose to schedule surgical removal or may leave if stable. Two issues discussed today with [name] date of birth April 19, 1975.  She was most concerned with possible enlargement of a mole on her left upper back; subsequently this has actually gotten smaller.  Examination showed a subtly feelable slightly grayish papule below the skin which represents a benign epidermoid cyst.  I explained that this could get a little larger or inflamed but is not prone to ever become skin cancer.  If [name] insists on surgery she will schedule 30 minutes and understands that I will leave a little scar.  Secondly she is prone to get a somewhat itchy rash, most recently on the left inner shoulder and the left arm.  There does not seem to be any historical contact exposure that causes this.  This would fit a mild adult  eczema and she is given a prescription for generic triamcinolone which she can apply as soon as new rash appears, daily after bathing for 1 to 2 weeks.  She should not apply this cream on the face or the body folds like under arm or breast or leg crease.  If it is effective and she runs out of refills, I will likely do this by telephone.

## 2019-09-10 NOTE — Patient Instructions (Addendum)
Two issues discussed today with [name] date of birth 12-30-1975.  She was most concerned with possible enlargement of a mole on her left upper back; subsequently this has actually gotten smaller.  Examination showed a subtly feelable slightly grayish papule below the skin which represents a benign epidermoid cyst.  I explained that this could get a little larger or inflamed but is not prone to ever become skin cancer.  If [name] insists on surgery she will schedule 30 minutes and understands that it will leave a little scar.  Secondly she is prone to get a somewhat itchy rash, most recently on the left inner shoulder and the left arm.  There does not seem to be any historical contact exposure that causes this.  This would fit a mild adult eczema and she is given a prescription for generic triamcinolone which she can apply as soon as new rash appears, daily after bathing for 1 to 2 weeks.  She should not apply this cream on the face or the body folds like under arm or breast or leg crease.  If it is effective and she runs out of refills, I will likely do this by telephone.

## 2019-10-05 ENCOUNTER — Encounter: Payer: Self-pay | Admitting: Dermatology

## 2019-12-10 ENCOUNTER — Other Ambulatory Visit: Payer: Self-pay | Admitting: Orthopedic Surgery

## 2019-12-10 DIAGNOSIS — M25521 Pain in right elbow: Secondary | ICD-10-CM
